# Patient Record
Sex: Male | Born: 1995 | Race: Black or African American | Hispanic: No | Marital: Single | Smoking: Never smoker
Health system: Southern US, Community
[De-identification: ages and names within clinical notes are randomized; demographics above are authoritative.]

## PROBLEM LIST (undated history)

## (undated) DIAGNOSIS — G43909 Migraine, unspecified, not intractable, without status migrainosus: Secondary | ICD-10-CM

## (undated) HISTORY — DX: Migraine, unspecified, not intractable, without status migrainosus: G43.909

## (undated) HISTORY — PX: TYMPANOSTOMY TUBE PLACEMENT: SHX32

---

## 2015-08-01 ENCOUNTER — Encounter: Payer: Self-pay | Admitting: Family Medicine

## 2015-08-01 ENCOUNTER — Ambulatory Visit (INDEPENDENT_AMBULATORY_CARE_PROVIDER_SITE_OTHER): Payer: BLUE CROSS/BLUE SHIELD | Admitting: Family Medicine

## 2015-08-01 VITALS — BP 121/62 | HR 54 | Temp 97.5°F | Ht 71.0 in | Wt 142.0 lb

## 2015-08-01 DIAGNOSIS — Z0000 Encounter for general adult medical examination without abnormal findings: Secondary | ICD-10-CM

## 2015-08-01 DIAGNOSIS — G43909 Migraine, unspecified, not intractable, without status migrainosus: Secondary | ICD-10-CM | POA: Insufficient documentation

## 2015-08-01 LAB — URINALYSIS, ROUTINE W REFLEX MICROSCOPIC
Bilirubin, UA: NEGATIVE
GLUCOSE, UA: NEGATIVE
KETONES UA: NEGATIVE
LEUKOCYTES UA: NEGATIVE
Nitrite, UA: NEGATIVE
Protein, UA: NEGATIVE
RBC, UA: NEGATIVE
Specific Gravity, UA: 1.02 (ref 1.005–1.030)
UUROB: 0.2 mg/dL (ref 0.2–1.0)
pH, UA: 6 (ref 5.0–7.5)

## 2015-08-01 NOTE — Progress Notes (Signed)
   BP 121/62 mmHg  Pulse 54  Temp(Src) 97.5 F (36.4 C)  Ht 5\' 11"  (1.803 m)  Wt 142 lb (64.411 kg)  BMI 19.81 kg/m2  SpO2 99%   Subjective:    Patient ID: Howard Ewing, male    DOB: 10/08/1996, 19 y.o.   MRN: [id number]  HPI: Howard Ewing is a 19 y.o. male  Chief Complaint  Patient presents with  . 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 Interim medical history since our last visit reviewed. Allergies and medications reviewed and updated.  Review of Systems  Constitutional: Negative.   HENT: Negative.   Eyes: Negative.   Respiratory: Negative.   Cardiovascular: Negative.   Gastrointestinal: Negative.   Endocrine: Negative.   Genitourinary: Negative.   Musculoskeletal: Negative.   Skin: Negative.   Allergic/Immunologic: Negative.   Neurological: Negative.   Hematological: Negative.   Psychiatric/Behavioral: Negative.     Per HPI unless specifically indicated above     Objective:    BP 121/62 mmHg  Pulse 54  Temp(Src) 97.5 F (36.4 C)  Ht 5\' 11"  (1.803 m)  Wt 142 lb (64.411 kg)  BMI 19.81 kg/m2  SpO2 99%  Wt Readings from Last 3 Encounters:  08/01/15 142 lb (64.411 kg) (28 %*, Z = -0.59)  05/22/14 136 lb (61.689 kg) (24 %*, Z = -0.71)   * Growth percentiles are based on CDC 2-20 Years data.    Physical Exam  Constitutional: He is oriented to person, place, and time. He appears well-developed and well-nourished.  HENT:  Head: Normocephalic.  Right Ear: External ear normal.  Left Ear: External ear normal.  Nose: Nose normal.  Eyes: Conjunctivae and EOM are normal. Pupils are equal, round, and reactive to light.  Neck: Normal range of motion. Neck supple. No thyromegaly present.  Cardiovascular: Normal rate, regular rhythm, normal heart sounds and intact distal pulses.    Pulmonary/Chest: Effort normal and breath sounds normal.  Abdominal: Soft. Bowel sounds are normal. There is no splenomegaly or hepatomegaly.  Genitourinary: Penis normal.  Musculoskeletal: Normal range of motion.  Lymphadenopathy:    He has no cervical adenopathy.  Neurological: He is alert and oriented to person, place, and time. He has normal reflexes.  Skin: Skin is warm and dry.  Psychiatric: He has a normal mood and affect. His behavior is normal. Judgment and thought content normal.    No results found for this or any previous visit.    Assessment & Plan:   Problem List Items Addressed This Visit    None    Visit Diagnoses    PE (physical exam), annual    -  Primary    Relevant Orders    Comprehensive metabolic panel    Lipid panel    CBC with Differential/Platelet    TSH    Urinalysis, Routine w reflex microscopic (not at [facility])        Follow up plan: Return if symptoms worsen or fail to improve.

## 2015-08-02 LAB — COMPREHENSIVE METABOLIC PANEL
A/G RATIO: 1.7 (ref 1.1–2.5)
ALBUMIN: 4.5 g/dL (ref 3.5–5.5)
ALK PHOS: 60 IU/L (ref 39–117)
ALT: 22 IU/L (ref 0–44)
AST: 32 IU/L (ref 0–40)
BILIRUBIN TOTAL: 1.1 mg/dL (ref 0.0–1.2)
BUN / CREAT RATIO: 19 (ref 8–19)
BUN: 17 mg/dL (ref 6–20)
CHLORIDE: 99 mmol/L (ref 97–108)
CO2: 18 mmol/L (ref 18–29)
Calcium: 9.5 mg/dL (ref 8.7–10.2)
Creatinine, Ser: 0.91 mg/dL (ref 0.76–1.27)
GFR calc non Af Amer: 122 mL/min/{1.73_m2} (ref >59)
GFR, EST AFRICAN AMERICAN: 141 mL/min/{1.73_m2} (ref >59)
GLUCOSE: 88 mg/dL (ref 65–99)
Globulin, Total: 2.6 g/dL (ref 1.5–4.5)
POTASSIUM: 5 mmol/L (ref 3.5–5.2)
Sodium: 139 mmol/L (ref 134–144)
TOTAL PROTEIN: 7.1 g/dL (ref 6.0–8.5)

## 2015-08-02 LAB — CBC WITH DIFFERENTIAL/PLATELET
Basophils Absolute: 0 10*3/uL (ref 0.0–0.2)
Basos: 1 %
EOS (ABSOLUTE): 0.1 10*3/uL (ref 0.0–0.4)
EOS: 2 %
HEMATOCRIT: 42.4 % (ref 37.5–51.0)
HEMOGLOBIN: 13.8 g/dL (ref 12.6–17.7)
IMMATURE GRANS (ABS): 0 10*3/uL (ref 0.0–0.1)
IMMATURE GRANULOCYTES: 0 %
LYMPHS: 48 %
Lymphocytes Absolute: 1.8 10*3/uL (ref 0.7–3.1)
MCH: 28.6 pg (ref 26.6–33.0)
MCHC: 32.5 g/dL (ref 31.5–35.7)
MCV: 88 fL (ref 79–97)
MONOCYTES: 14 %
MONOS ABS: 0.5 10*3/uL (ref 0.1–0.9)
NEUTROS PCT: 35 %
Neutrophils Absolute: 1.3 10*3/uL — ABNORMAL LOW (ref 1.4–7.0)
Platelets: 232 10*3/uL (ref 150–379)
RBC: 4.82 x10E6/uL (ref 4.14–5.80)
RDW: 11.7 % — AB (ref 12.3–15.4)
WBC: 3.8 10*3/uL (ref 3.4–10.8)

## 2015-08-02 LAB — LIPID PANEL
CHOLESTEROL TOTAL: 155 mg/dL (ref 100–169)
Chol/HDL Ratio: 2.9 ratio units (ref 0.0–5.0)
HDL: 54 mg/dL (ref >39)
LDL Calculated: 90 mg/dL (ref 0–109)
Triglycerides: 54 mg/dL (ref 0–89)
VLDL Cholesterol Cal: 11 mg/dL (ref 5–40)

## 2015-08-02 LAB — TSH: TSH: 1.83 u[IU]/mL (ref 0.450–4.500)

## 2015-08-05 ENCOUNTER — Encounter: Payer: Self-pay | Admitting: Family Medicine

## 2017-12-09 ENCOUNTER — Emergency Department
Admission: EM | Admit: 2017-12-09 | Discharge: 2017-12-09 | Disposition: A | Discharge status: Home / Self Care | Payer: 59 | Attending: Emergency Medicine | Admitting: Emergency Medicine

## 2017-12-09 ENCOUNTER — Emergency Department: Payer: 59

## 2017-12-09 ENCOUNTER — Encounter: Payer: Self-pay | Admitting: Emergency Medicine

## 2017-12-09 DIAGNOSIS — Y999 Unspecified external cause status: Secondary | ICD-10-CM | POA: Insufficient documentation

## 2017-12-09 DIAGNOSIS — S8992XA Unspecified injury of left lower leg, initial encounter: Secondary | ICD-10-CM | POA: Diagnosis present

## 2017-12-09 DIAGNOSIS — Y92410 Unspecified street and highway as the place of occurrence of the external cause: Secondary | ICD-10-CM | POA: Insufficient documentation

## 2017-12-09 DIAGNOSIS — Y9389 Activity, other specified: Secondary | ICD-10-CM | POA: Insufficient documentation

## 2017-12-09 DIAGNOSIS — S8002XA Contusion of left knee, initial encounter: Secondary | ICD-10-CM | POA: Diagnosis not present

## 2017-12-09 MED ORDER — NAPROXEN 500 MG PO TABS: 500.0000 mg | ORAL_TABLET | Freq: Two times a day (BID) | ORAL | 0 refills | Status: AC
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
## 2019-04-06 ENCOUNTER — Emergency Department: Payer: BC Managed Care – PPO

## 2019-04-06 ENCOUNTER — Emergency Department
Admission: EM | Admit: 2019-04-06 | Discharge: 2019-04-06 | Disposition: A | Discharge status: Home / Self Care | Payer: BC Managed Care – PPO | Attending: Emergency Medicine | Admitting: Emergency Medicine

## 2019-04-06 ENCOUNTER — Ambulatory Visit: Payer: Self-pay | Admitting: *Deleted

## 2019-04-06 ENCOUNTER — Other Ambulatory Visit: Payer: Self-pay

## 2019-04-06 DIAGNOSIS — Z20828 Contact with and (suspected) exposure to other viral communicable diseases: Secondary | ICD-10-CM | POA: Insufficient documentation

## 2019-04-06 DIAGNOSIS — Z79899 Other long term (current) drug therapy: Secondary | ICD-10-CM | POA: Insufficient documentation

## 2019-04-06 DIAGNOSIS — B349 Viral infection, unspecified: Secondary | ICD-10-CM | POA: Diagnosis not present

## 2019-04-06 DIAGNOSIS — R55 Syncope and collapse: Secondary | ICD-10-CM | POA: Diagnosis present

## 2019-04-06 LAB — BASIC METABOLIC PANEL
Anion gap: 8 (ref 5–15)
BUN: 19 mg/dL (ref 6–20)
CO2: 28 mmol/L (ref 22–32)
Calcium: 9.3 mg/dL (ref 8.9–10.3)
Chloride: 97 mmol/L — ABNORMAL LOW (ref 98–111)
Creatinine, Ser: 0.91 mg/dL (ref 0.61–1.24)
GFR calc Af Amer: >60 mL/min (ref >60)
GFR calc non Af Amer: >60 mL/min (ref >60)
Glucose, Bld: 101 mg/dL — ABNORMAL HIGH (ref 70–99)
Potassium: 3.8 mmol/L (ref 3.5–5.1)
Sodium: 133 mmol/L — ABNORMAL LOW (ref 135–145)

## 2019-04-06 LAB — URINALYSIS, COMPLETE (UACMP) WITH MICROSCOPIC
Bacteria, UA: NONE SEEN
Bilirubin Urine: NEGATIVE
Glucose, UA: NEGATIVE mg/dL
Ketones, ur: 5 mg/dL — AB
Leukocytes,Ua: NEGATIVE
Nitrite: NEGATIVE
Protein, ur: 30 mg/dL — AB
Specific Gravity, Urine: 1.026 (ref 1.005–1.030)
pH: 5 (ref 5.0–8.0)

## 2019-04-06 LAB — CBC
HCT: 43.5 % (ref 39.0–52.0)
Hemoglobin: 13.9 g/dL (ref 13.0–17.0)
MCH: 28.7 pg (ref 26.0–34.0)
MCHC: 32 g/dL (ref 30.0–36.0)
MCV: 89.7 fL (ref 80.0–100.0)
Platelets: 206 10*3/uL (ref 150–400)
RBC: 4.85 MIL/uL (ref 4.22–5.81)
RDW: 11.4 % — ABNORMAL LOW (ref 11.5–15.5)
WBC: 14.7 10*3/uL — ABNORMAL HIGH (ref 4.0–10.5)
nRBC: 0 % (ref 0.0–0.2)

## 2019-04-06 LAB — SARS CORONAVIRUS 2 BY RT PCR (HOSPITAL ORDER, PERFORMED IN ~~LOC~~ HOSPITAL LAB): SARS Coronavirus 2: NEGATIVE

## 2019-04-06 LAB — TROPONIN I: Troponin I: <0.03 ng/mL (ref <0.03)

## 2019-04-06 LAB — GROUP A STREP BY PCR: Group A Strep by PCR: NOT DETECTED

## 2019-04-06 LAB — MONONUCLEOSIS SCREEN: Mono Screen: NEGATIVE

## 2019-04-06 MED ORDER — MAGIC MOUTHWASH W/LIDOCAINE: 5.0000 mL | Freq: Four times a day (QID) | ORAL | 0 refills | Status: AC

## 2019-04-06 MED ORDER — PSEUDOEPH-BROMPHEN-DM 30-2-10 MG/5ML PO SYRP: 10.0000 mL | ORAL_SOLUTION | Freq: Four times a day (QID) | ORAL | 0 refills | Status: AC | PRN

## 2019-04-06 MED ORDER — SODIUM CHLORIDE 0.9% FLUSH: 3.0000 mL | Freq: Once | INTRAVENOUS | Status: DC

## 2019-04-06 MED ORDER — ACETAMINOPHEN 325 MG PO TABS
650.0000 mg | ORAL_TABLET | Freq: Once | ORAL | Status: AC
  Administered 2019-04-06: 650 mg via ORAL
  Filled 2019-04-06: qty 2

## 2019-04-06 NOTE — ED Triage Notes (Signed)
Pt states he pass out twice yesterday, once hitting the back of his head. States he has been having chills , sore throat and slight HA today. Pt is c/ox4 on arrival with a steady gait.

## 2019-04-06 NOTE — ED Notes (Signed)
Sent green and purple top tubes to lab. 

## 2019-04-06 NOTE — Telephone Encounter (Signed)
Mis routed chart- my fault- sorry

## 2019-04-06 NOTE — Telephone Encounter (Signed)
Mother is calling to report patient has 2 fainting episodes- yesterday and early this morning  Patient has had 2 episodes of fainting- both when he got up from laying position. Patient got up at 9 pm to go to bathroom and then at 2 am to get something to eat.  Per protocol- patient advised ED due to 2 episodes within short period of time  Reason for Disposition . Fainted 2 times in one day  Answer Assessment - Initial Assessment Questions 1. ONSET: "How long were you unconscious?" (minutes) "When did it happen?"     Seconds- 10, yesterday and 2 am this morning 2. CONTENT: "What happened during period of unconsciousness?" (e.g., seizure activity)      No seizure activity- patient was alert afterward- but shaky  3. MENTAL STATUS: "Alert and oriented now?" (oriented x 3 = name, month, location)      Alert and oriented 4. TRIGGER: "What do you think caused the fainting?" "What were you doing just before you fainted?"  (e.g., exercise, sudden standing up, prolonged standing)     Patient has complained of headache all day, patient has awoke and got up this morning to get something out of the refrigerator  5. RECURRENT SYMPTOM: "Have you ever passed out before?" If so, ask: "When was the last time?" and "What happened that time?"      no 6. INJURY: "Did you sustain any injury during the fall?"      no 7. CARDIAC SYMPTOMS: "Have you had any of the following symptoms: chest pain, difficulty breathing, palpitations?"     no 8. NEUROLOGIC SYMPTOMS: "Have you had any of the following symptoms: headache, numbness, vertigo, weakness?"     headache 9. GI SYMPTOMS: "Have you had any of the following symptoms: abdominal pain, vomiting, diarrhea, blood in stools?"     no 10. OTHER SYMPTOMS: "Do you have any other symptoms?"       no 11. PREGNANCY: "Is there any chance you are pregnant?" "When was your last menstrual period?"       n/a  Protocols used: [facility]

## 2019-04-06 NOTE — ED Provider Notes (Signed)
[facility] [place] Emergency Department Provider Note  ____________________________________________  Time seen: Approximately 6:24 PM  I have reviewed the triage vital signs and the nursing notes.   HISTORY  Chief Complaint Loss of Consciousness    HPI Howard Ewing is a 23 y.o. male who presents the emergency department complaining of fever, body aches, sore throat, intermittent cough, 2 episodes of syncope.  Patient reports that he has also had a headache for 2 to 3 days.  Patient reports that he had 2 unexplained episodes of syncope yesterday.  No history of same.  Patient reports that he did hit his head rather hard on the floor during 1 of the episodes.  Patient has developed neck pain after striking his head on the floor.  Patient denies any chest pain, abdominal pain, nausea vomiting, diarrhea or constipation.  Patient has been taking Tylenol and Robitussin at home for symptom relief.  No known sick contacts.  No chronic medical problems.  No cardiac history use.  No history of syncope.         Past Medical History:  Diagnosis Date  . Migraine     There are no active problems to display for this patient.   Past Surgical History:  Procedure Laterality Date  . TYMPANOSTOMY TUBE PLACEMENT     age 78    Prior to Admission medications   Medication Sig Start Date End Date Taking? Authorizing Provider  brompheniramine-pseudoephedrine-DM 30-2-10 MG/5ML syrup Take 10 mLs by mouth 4 (four) times daily as needed. 04/06/19   Cuthriell, Charline Bills, PA-C  Doxycycline Hyclate 50 MG TBEC  07/29/15   [provider]  magic mouthwash w/lidocaine SOLN Take 5 mLs by mouth 4 (four) times daily. 04/06/19   Cuthriell, Charline Bills, PA-C  naproxen (NAPROSYN) 500 MG tablet Take 1 tablet (500 mg total) by mouth 2 (two) times daily with a meal. 12/09/17   Johnn Hai, PA-C  tretinoin (RETIN-A) 0.025 % cream  07/29/15   [provider]     Allergies Patient has no known allergies.  Family History  Problem Relation Age of Onset  . Stroke Maternal Grandmother     Social History Social History   Tobacco Use  . Smoking status: Never Smoker  . Smokeless tobacco: Never Used  Substance Use Topics  . Alcohol use: No    Alcohol/week: 0.0 standard drinks  . Drug use: No     Review of Systems  Constitutional: Positive fever/chills Eyes: No visual changes. No discharge ENT: Positive for sore throat Cardiovascular: no chest pain. Respiratory: Intermittent cough. No SOB. Gastrointestinal: No abdominal pain.  No nausea, no vomiting.  No diarrhea.  No constipation. Genitourinary: Negative for dysuria. No hematuria Musculoskeletal: Positive for neck pain after falling. Skin: Negative for rash, abrasions, lacerations, ecchymosis. Neurological: Positive for headache but denies focal weakness or numbness. 10-point ROS otherwise negative.  ____________________________________________   PHYSICAL EXAM:  VITAL SIGNS: ED Triage Vitals  Enc Vitals Group     BP 04/06/19 1339 (!) 143/74     Pulse Rate 04/06/19 1339 (!) 103     Resp 04/06/19 1339 18     Temp 04/06/19 1339 (!) 102.4 F (39.1 C)     Temp Source 04/06/19 1339 Oral     SpO2 04/06/19 1429 97 %     Weight 04/06/19 1357 160 lb (72.6 kg)     Height 04/06/19 1357 6' (1.829 m)     Head Circumference --      Peak Flow --  Pain Score 04/06/19 1356 3     Pain Loc --      Pain Edu? --      Excl. in GC? --      Constitutional: Alert and oriented. Well appearing and in no acute distress. Eyes: Conjunctivae are normal. PERRL. EOMI. Head: Atraumatic.  No visible signs of external trauma.  Patient is nontender palpation of the osseous structures of the skull and face.  No palpable abnormalities.  No raccoon eyes, battle signs, serosanguineous fluid drainage from the ears or nares. ENT:      Ears:       Nose: No congestion/rhinnorhea.      Mouth/Throat:  Mucous membranes are moist.  Neck: No stridor.  Midline tenderness over C6 prominence.  No tenderness to palpation of the cervical spine.  No palpable abnormality or step-off.  Radial pulse intact bilateral upper extremities.  Sensation intact and equal bilateral upper extremities. Hematological/Lymphatic/Immunilogical: No cervical lymphadenopathy. Cardiovascular: Normal rate, regular rhythm. Normal S1 and S2.  Good peripheral circulation. Respiratory: Normal respiratory effort without tachypnea or retractions. Lungs CTAB. Good air entry to the bases with no decreased or absent breath sounds. Gastrointestinal: Bowel sounds 4 quadrants. Soft and nontender to palpation. No guarding or rigidity. No palpable masses. No distention. No CVA tenderness. Musculoskeletal: Full range of motion to all extremities. No gross deformities appreciated. Neurologic:  Normal speech and language. No gross focal neurologic deficits are appreciated.  Skin:  Skin is warm, dry and intact. No rash noted. Psychiatric: Mood and affect are normal. Speech and behavior are normal. Patient exhibits appropriate insight and judgement.   ____________________________________________   LABS (all labs ordered are listed, but only abnormal results are displayed)  Labs Reviewed  BASIC METABOLIC PANEL - Abnormal; Notable for the following components:      Result Value   Sodium 133 (*)    Chloride 97 (*)    Glucose, Bld 101 (*)    All other components within normal limits  CBC - Abnormal; Notable for the following components:   WBC 14.7 (*)    RDW 11.4 (*)    All other components within normal limits  URINALYSIS, COMPLETE (UACMP) WITH MICROSCOPIC - Abnormal; Notable for the following components:   Color, Urine YELLOW (*)    APPearance HAZY (*)    Hgb urine dipstick SMALL (*)    Ketones, ur 5 (*)    Protein, ur 30 (*)    All other components within normal limits  SARS CORONAVIRUS 2 (HOSPITAL ORDER, PERFORMED IN [facility])  GROUP A STREP BY PCR  MONONUCLEOSIS SCREEN  TROPONIN I   ____________________________________________  EKG  ED ECG REPORT I, Delorise RoyalsJonathan D Cuthriell,  personally viewed and interpreted this ECG.   Date: 04/06/2019  EKG Time: 1435 hrs.  Rate: 93 bpm  Rhythm: there are no previous tracings available for comparison, normal sinus rhythm, nonspecific ST elevation in the anterolateral leads  Axis: Normal axis  Intervals:none  ST&T Change: Nonspecific ST elevation in anterolateral leads.  No STEMI.  Incomplete right bundle branch block.   ____________________________________________  RADIOLOGY I personally viewed and evaluated these images as part of my medical decision making, as well as reviewing the written report by the radiologist.  Dg Chest 1 View  Result Date: 04/06/2019 CLINICAL DATA:  Fever, syncope EXAM: CHEST  1 VIEW COMPARISON:  None. FINDINGS: The heart size and mediastinal contours are within normal limits. Both lungs are clear. The visualized skeletal structures are unremarkable. IMPRESSION:  No active disease. Electronically Signed   By: Jasmine PangKim  Fujinaga M.D.   On: 04/06/2019 19:25   Ct Head Wo Contrast  Result Date: 04/06/2019 CLINICAL DATA:  23 y/o M; multiple episodes of syncope. Injury to the back of the head. Patient reports chills, sore throat, headache. EXAM: CT HEAD WITHOUT CONTRAST CT CERVICAL SPINE WITHOUT CONTRAST TECHNIQUE: Multidetector CT imaging of the head and cervical spine was performed following the standard protocol without intravenous contrast. Multiplanar CT image reconstructions of the cervical spine were also generated. COMPARISON:  None. FINDINGS: CT HEAD FINDINGS Brain: No evidence of acute infarction, hemorrhage, hydrocephalus, extra-axial collection or mass lesion/mass effect. Vascular: No hyperdense vessel or unexpected calcification. Skull: Normal. Negative for fracture or focal lesion. Sinuses/Orbits: No acute finding. Other: None. CT  CERVICAL SPINE FINDINGS Alignment: Straightening of cervical lordosis without listhesis. Skull base and vertebrae: No acute fracture. No primary bone lesion or focal pathologic process. Soft tissues and spinal canal: Negative. Disc levels:  Negative. Upper chest: Negative. Other: Negative. IMPRESSION: 1. No acute intracranial abnormality or calvarial fracture. Unremarkable CT of the head. 2. No acute fracture or dislocation of cervical spine. Straightening of cervical lordosis. Electronically Signed   By: Mitzi HansenLance  Furusawa-Stratton M.D.   On: 04/06/2019 20:26   Ct Cervical Spine Wo Contrast  Result Date: 04/06/2019 CLINICAL DATA:  23 y/o M; multiple episodes of syncope. Injury to the back of the head. Patient reports chills, sore throat, headache. EXAM: CT HEAD WITHOUT CONTRAST CT CERVICAL SPINE WITHOUT CONTRAST TECHNIQUE: Multidetector CT imaging of the head and cervical spine was performed following the standard protocol without intravenous contrast. Multiplanar CT image reconstructions of the cervical spine were also generated. COMPARISON:  None. FINDINGS: CT HEAD FINDINGS Brain: No evidence of acute infarction, hemorrhage, hydrocephalus, extra-axial collection or mass lesion/mass effect. Vascular: No hyperdense vessel or unexpected calcification. Skull: Normal. Negative for fracture or focal lesion. Sinuses/Orbits: No acute finding. Other: None. CT CERVICAL SPINE FINDINGS Alignment: Straightening of cervical lordosis without listhesis. Skull base and vertebrae: No acute fracture. No primary bone lesion or focal pathologic process. Soft tissues and spinal canal: Negative. Disc levels:  Negative. Upper chest: Negative. Other: Negative. IMPRESSION: 1. No acute intracranial abnormality or calvarial fracture. Unremarkable CT of the head. 2. No acute fracture or dislocation of cervical spine. Straightening of cervical lordosis. Electronically Signed   By: Mitzi HansenLance  Furusawa-Stratton M.D.   On: 04/06/2019 20:26     ____________________________________________    PROCEDURES  Procedure(s) performed:    Procedures    Medications  sodium chloride flush (NS) 0.9 % injection 3 mL (has no administration in time range)  acetaminophen (TYLENOL) tablet 650 mg (650 mg Oral Given 04/06/19 1427)     ____________________________________________   INITIAL IMPRESSION / ASSESSMENT AND PLAN / ED COURSE  Pertinent labs & imaging results that were available during my care of the patient were reviewed by me and considered in my medical decision making (see chart for details).  Review of the [place] CSRS was performed in accordance of the [facility] prior to dispensing any controlled drugs.           Patient's diagnosis is consistent with viral illness, syncope.  Patient presented to the emergency department with several day history of subjective fever, sore throat, intermittent cough, headache.  Patient was concerned as he had 2 syncopal episodes yesterday, one striking his head.  Patient reports that his headache worsened after striking his head.  Differential included COVID, strep, mono, dehydration, viral illness, head trauma.  Patient was evaluated with labs and imaging.  Results returned with reassuring findings with no acute osseous, intracranial abnormality, cardiopulmonary abnormality.  Labs were reassuring with no evidence of COVID, strep, mono.  Patient did have a slightly elevated white blood cell count consistent with viral illness.  Patient encouraged to drink plenty of fluids.  Symptom control medications will be prescribed.  Tylenol and Motrin at home as needed for additional symptom relief.  Follow-up with primary care as needed..Patient is given ED precautions to return to the ED for any worsening or new symptoms.     ____________________________________________  FINAL CLINICAL IMPRESSION(S) / ED DIAGNOSES  Final diagnoses:  Viral illness  Syncope, unspecified syncope type      NEW  MEDICATIONS STARTED DURING THIS VISIT:  ED Discharge Orders         Ordered    magic mouthwash w/lidocaine SOLN  4 times daily    Note to Pharmacy: Dispense in a 1/1/1 ratio. Use lidocaine, diphenhydramine, prednisolone   04/06/19 2048    brompheniramine-pseudoephedrine-DM 30-2-10 MG/5ML syrup  4 times daily PRN     04/06/19 2048              This chart was dictated using voice recognition software/Dragon. Despite best efforts to proofread, errors can occur which can change the meaning. Any change was purely unintentional.    Racheal PatchesCuthriell, Jonathan D, PA-C 04/06/19 2050    Shaune PollackIsaacs, Cameron, MD 04/07/19 [phone]

## 2020-02-05 ENCOUNTER — Emergency Department (HOSPITAL_COMMUNITY): Payer: BC Managed Care – PPO

## 2020-02-05 ENCOUNTER — Encounter (HOSPITAL_COMMUNITY): Payer: Self-pay

## 2020-02-05 ENCOUNTER — Emergency Department (HOSPITAL_COMMUNITY)
Admission: EM | Admit: 2020-02-05 | Discharge: 2020-02-05 | Disposition: A | Discharge status: Home / Self Care | Payer: BC Managed Care – PPO | Attending: Emergency Medicine | Admitting: Emergency Medicine

## 2020-02-05 ENCOUNTER — Other Ambulatory Visit: Payer: Self-pay

## 2020-02-05 DIAGNOSIS — S63291A Dislocation of distal interphalangeal joint of left index finger, initial encounter: Secondary | ICD-10-CM | POA: Insufficient documentation

## 2020-02-05 DIAGNOSIS — S6992XA Unspecified injury of left wrist, hand and finger(s), initial encounter: Secondary | ICD-10-CM | POA: Diagnosis present

## 2020-02-05 DIAGNOSIS — Z23 Encounter for immunization: Secondary | ICD-10-CM | POA: Insufficient documentation

## 2020-02-05 DIAGNOSIS — W01198A Fall on same level from slipping, tripping and stumbling with subsequent striking against other object, initial encounter: Secondary | ICD-10-CM | POA: Diagnosis not present

## 2020-02-05 DIAGNOSIS — Y9367 Activity, basketball: Secondary | ICD-10-CM | POA: Diagnosis not present

## 2020-02-05 DIAGNOSIS — Y999 Unspecified external cause status: Secondary | ICD-10-CM | POA: Diagnosis not present

## 2020-02-05 DIAGNOSIS — Z79899 Other long term (current) drug therapy: Secondary | ICD-10-CM | POA: Diagnosis not present

## 2020-02-05 DIAGNOSIS — Y92310 Basketball court as the place of occurrence of the external cause: Secondary | ICD-10-CM | POA: Insufficient documentation

## 2020-02-05 DIAGNOSIS — S63259A Unspecified dislocation of unspecified finger, initial encounter: Secondary | ICD-10-CM

## 2020-02-05 MED ORDER — TETANUS-DIPHTH-ACELL PERTUSSIS 5-2.5-18.5 LF-MCG/0.5 IM SUSP
0.5000 mL | Freq: Once | INTRAMUSCULAR | Status: AC
  Administered 2020-02-05: 0.5 mL via INTRAMUSCULAR
  Filled 2020-02-05: qty 0.5

## 2020-02-05 MED ORDER — CEPHALEXIN 500 MG PO CAPS
500.0000 mg | ORAL_CAPSULE | Freq: Once | ORAL | Status: AC
  Administered 2020-02-05: 500 mg via ORAL
  Filled 2020-02-05: qty 1

## 2020-02-05 MED ORDER — LIDOCAINE HCL 2 % IJ SOLN
10.0000 mL | Freq: Once | INTRAMUSCULAR | Status: AC
  Administered 2020-02-05: 21:00:00 200 mg via INTRADERMAL
  Filled 2020-02-05: qty 20

## 2020-02-05 MED ORDER — CEPHALEXIN 500 MG PO CAPS: 500.0000 mg | ORAL_CAPSULE | Freq: Four times a day (QID) | ORAL | 0 refills | Status: AC

## 2020-02-05 NOTE — Discharge Instructions (Addendum)
Clean finger twice a day gently with soap and water and reapply bandage and splint.  Call Dr. Carollee Massed office tomorrow and make arrangements for follow-up this week.  Take Tylenol Motrin for pain

## 2020-02-05 NOTE — ED Provider Notes (Signed)
[facility]-EMERGENCY DEPT Provider Note   CSN: [id number] Arrival date & time: 02/05/20  1848     History Chief Complaint  Patient presents with  . Finger Injury    Howard Ewing is a 24 y.o. male.  Patient fell on his left index finger while playing basketball  The history is provided by the patient. No language interpreter was used.  Hand Injury Upper extremity pain location: left index. Injury: yes   Mechanism of injury: fall   Fall:    Fall occurred: playing basketball.   Impact surface:  Athletic surface Pain details:    Quality: hand.   Radiates to:  Does not radiate   Severity:  Mild Associated symptoms: no back pain and no fatigue        Past Medical History:  Diagnosis Date  . Migraine     There are no problems to display for this patient.   Past Surgical History:  Procedure Laterality Date  . TYMPANOSTOMY TUBE PLACEMENT     age 9       Family History  Problem Relation Age of Onset  . Stroke Maternal Grandmother     Social History   Tobacco Use  . Smoking status: Never Smoker  . Smokeless tobacco: Never Used  Substance Use Topics  . Alcohol use: No    Alcohol/week: 0.0 standard drinks  . Drug use: No    Home Medications Prior to Admission medications   Medication Sig Start Date End Date Taking? Authorizing Provider  finasteride (PROPECIA) 1 MG tablet Take 1 mg by mouth daily.   Yes [provider]  brompheniramine-pseudoephedrine-DM 30-2-10 MG/5ML syrup Take 10 mLs by mouth 4 (four) times daily as needed. Patient not taking: Reported on 02/05/2020 04/06/19   Cuthriell, Delorise Royals, PA-C  cephALEXin (KEFLEX) 500 MG capsule Take 1 capsule (500 mg total) by mouth 4 (four) times daily. 02/05/20   Bethann Berkshire, MD  magic mouthwash w/lidocaine SOLN Take 5 mLs by mouth 4 (four) times daily. Patient not taking: Reported on 02/05/2020 04/06/19   Cuthriell, Delorise Royals, PA-C  naproxen (NAPROSYN) 500 MG tablet Take  1 tablet (500 mg total) by mouth 2 (two) times daily with a meal. Patient not taking: Reported on 02/05/2020 12/09/17   Tommi Rumps, PA-C    Allergies    Bee pollen  Review of Systems   Review of Systems  Constitutional: Negative for appetite change and fatigue.  HENT: Negative for congestion, ear discharge and sinus pressure.   Eyes: Negative for discharge.  Respiratory: Negative for cough.   Cardiovascular: Negative for chest pain.  Gastrointestinal: Negative for abdominal pain and diarrhea.  Genitourinary: Negative for frequency and hematuria.  Musculoskeletal: Negative for back pain.       Left hand pain  Skin: Negative for rash.  Neurological: Negative for seizures and headaches.  Psychiatric/Behavioral: Negative for hallucinations.    Physical Exam Updated Vital Signs BP 131/90   Pulse 77   Temp 98.3 F (36.8 C) (Oral)   Resp 18   Ht 5\' 11"  (1.803 m)   Wt 63.5 kg   SpO2 99%   BMI 19.53 kg/m   Physical Exam Vitals and nursing note reviewed.  Constitutional:      Appearance: He is well-developed.  HENT:     Head: Normocephalic.     Nose: Nose normal.  Eyes:     Conjunctiva/sclera: Conjunctivae normal.  Neck:     Trachea: No tracheal deviation.  Cardiovascular:  Rate and Rhythm: Normal rate.  Pulmonary:     Effort: Pulmonary effort is normal.  Abdominal:     General: Abdomen is flat.  Musculoskeletal:     Comments: Pt has a deformity to left index finger with a superficial laceration ventally  Skin:    General: Skin is warm.  Neurological:     Mental Status: He is alert and oriented to person, place, and time.  Psychiatric:        Mood and Affect: Mood normal.     ED Results / Procedures / Treatments   Labs (all labs ordered are listed, but only abnormal results are displayed) Labs Reviewed - No data to display  EKG None  Radiology DG Finger Index Left  Result Date: 02/05/2020 CLINICAL DATA:  Deformity EXAM: LEFT INDEX FINGER 2+V  COMPARISON:  None. FINDINGS: Dorsal displacement of the base of the middle phalanx with respect to the head of the proximal phalanx. Probable small fracture fragment along the volar aspect of the base of the middle phalanx. No radiopaque foreign body IMPRESSION: Dorsal dislocation at the IP joint with probable small fracture fragment along the volar base of the middle phalanx. Electronically Signed   By: Donavan Foil M.D.   On: 02/05/2020 20:11    Procedures Procedures (including critical care time)  Medications Ordered in ED Medications  lidocaine (XYLOCAINE) 2 % (with pres) injection 200 mg (200 mg Intradermal Given 02/05/20 2038)  cephALEXin (KEFLEX) capsule 500 mg (500 mg Oral Given 02/05/20 2142)  Tdap (BOOSTRIX) injection 0.5 mL (0.5 mLs Intramuscular Given 02/05/20 2156)    ED Course  I have reviewed the triage vital signs and the nursing notes.  Pertinent labs & imaging results that were available during my care of the patient were reviewed by me and considered in my medical decision making (see chart for details). Patient had  pt with a dislocation of pip of left index.  Dislocation was reduced by dr. Regenia Skeeter.  Area cleaned and splinted.  Pt placed on keflex and told to follow up with hand md this week MDM Rules/Calculators/A&P                       Final Clinical Impression(s) / ED Diagnoses Final diagnoses:  Dislocation of finger, initial encounter    Rx / DC Orders ED Discharge Orders         Ordered    cephALEXin (KEFLEX) 500 MG capsule  4 times daily     02/05/20 2205           Milton Ferguson, MD 02/05/20 2214

## 2020-02-05 NOTE — ED Triage Notes (Signed)
Playing basketball. Left 2nd digit deformity.

## 2020-02-05 NOTE — ED Provider Notes (Signed)
Reduction of dislocation  Date/Time: 02/05/2020 9:09 PM Performed by: Pricilla Loveless, MD Authorized by: Pricilla Loveless, MD  Consent: Verbal consent obtained. Risks and benefits: risks, benefits and alternatives were discussed Consent given by: patient Patient identity confirmed: verbally with patient Preparation: Patient was prepped and draped in the usual sterile fashion. Patient tolerance: patient tolerated the procedure well with no immediate complications Comments: Dorsal dislocation of finger reduced with traction       Pricilla Loveless, MD 02/06/20 [phone]

## 2020-05-31 IMAGING — DX CHEST  1 VIEW
2 series · 2 of 2 positions shown · non-contrast
Comparison: None.

CLINICAL DATA: Fever, syncope

EXAM:
CHEST  1 VIEW

[chest ap (1 of 2)]
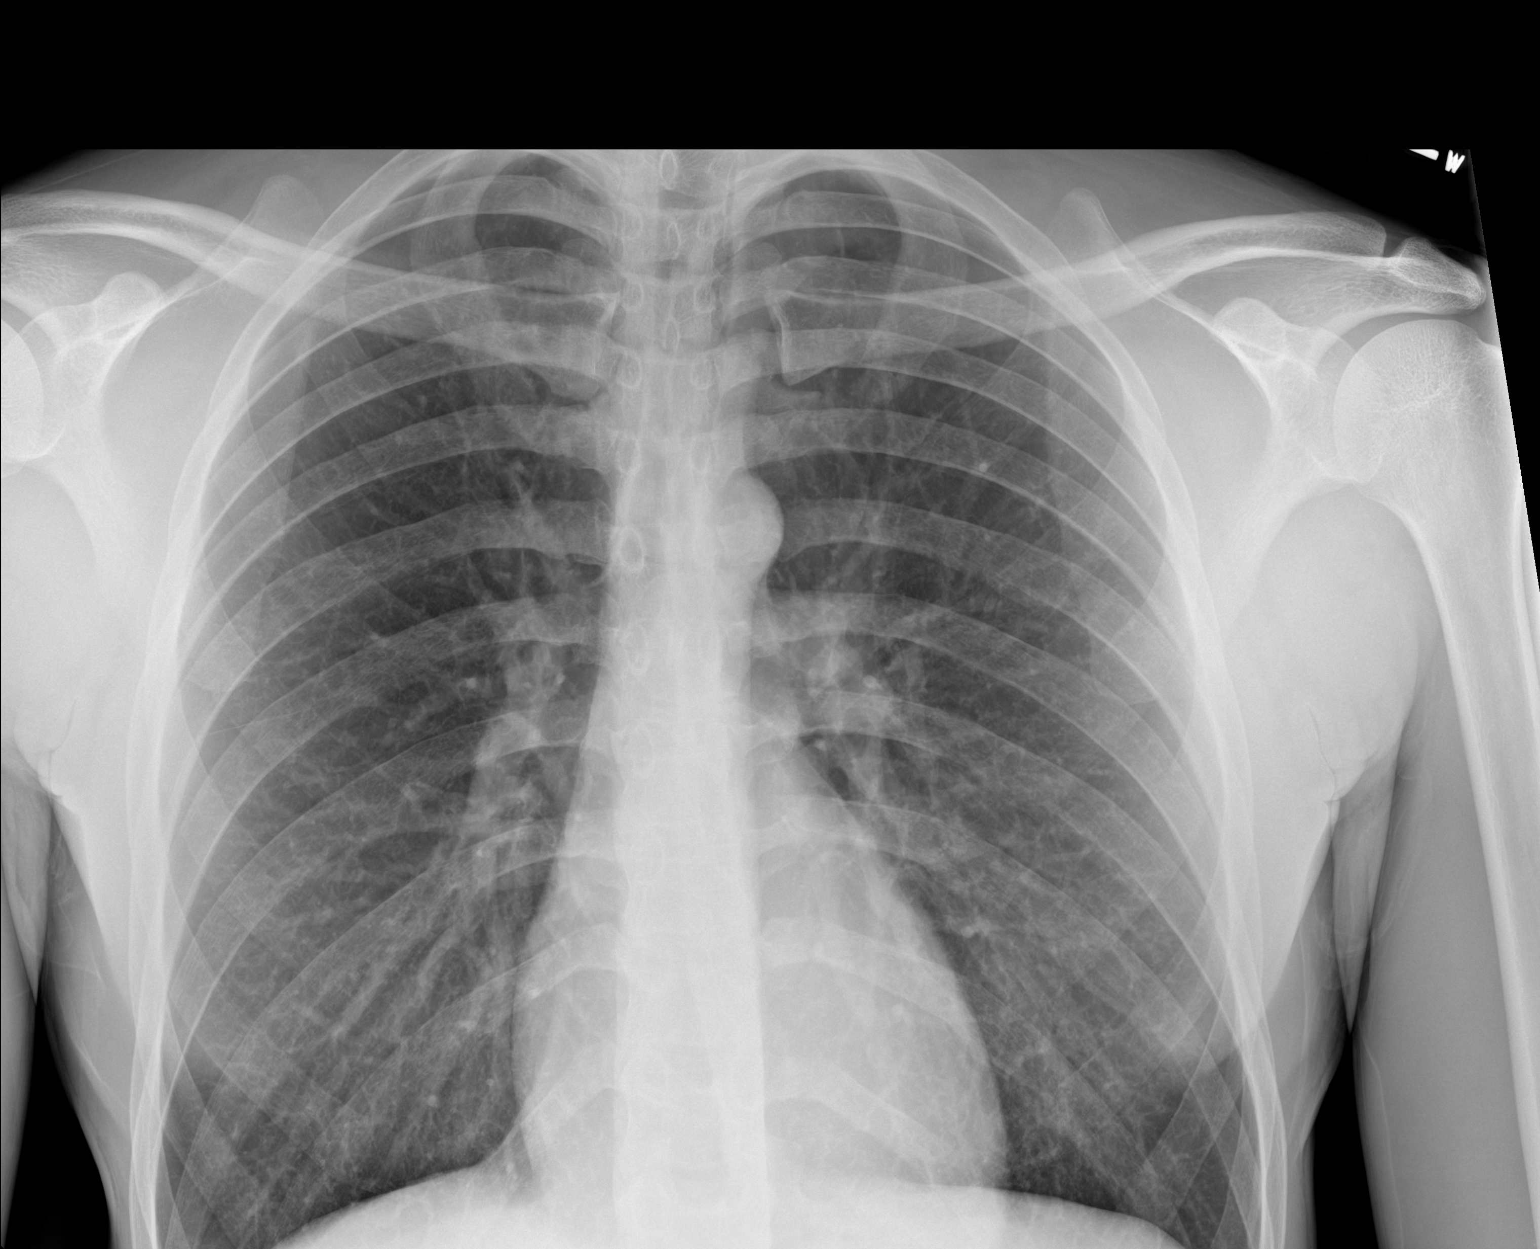

[chest ap (2 of 2)]
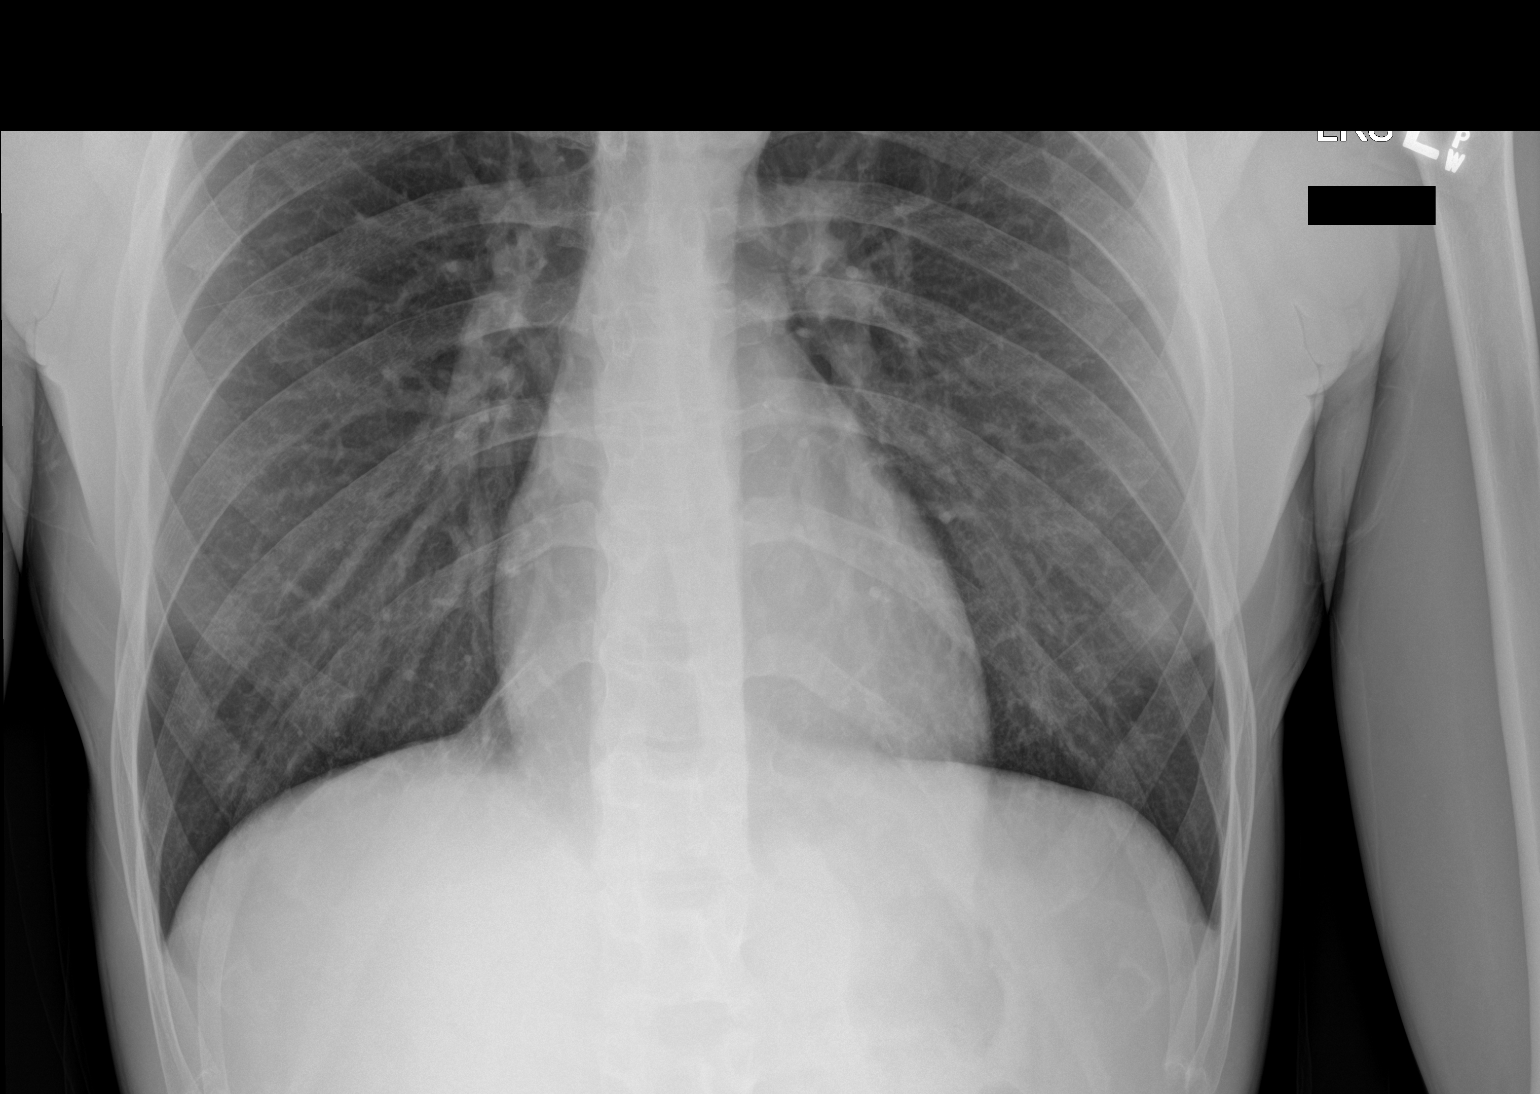

[2 of 2 positions shown; findings below may reference images not displayed]

FINDINGS: The heart size and mediastinal contours are within normal limits.
Both lungs are clear. The visualized skeletal structures are
unremarkable.
IMPRESSION: No active disease.
